# Patient Record
Sex: Female | Born: 1960 | Hispanic: No | Marital: Married
Health system: Southern US, Community
[De-identification: ages and names within clinical notes are randomized; demographics above are authoritative.]

---

## 1998-01-21 ENCOUNTER — Other Ambulatory Visit: Admission: RE | Admit: 1998-01-21 | Discharge: 1998-01-21 | Payer: Self-pay | Admitting: Obstetrics and Gynecology

## 1998-05-21 ENCOUNTER — Ambulatory Visit (HOSPITAL_COMMUNITY): Admission: RE | Admit: 1998-05-21 | Discharge: 1998-05-21 | Payer: Self-pay | Admitting: Obstetrics and Gynecology

## 1998-05-21 ENCOUNTER — Encounter: Payer: Self-pay | Admitting: Obstetrics and Gynecology

## 1998-10-26 ENCOUNTER — Inpatient Hospital Stay (HOSPITAL_COMMUNITY): Admission: AD | Admit: 1998-10-26 | Discharge: 1998-10-28 | Payer: Self-pay | Admitting: Obstetrics and Gynecology

## 1998-10-29 ENCOUNTER — Encounter (HOSPITAL_COMMUNITY): Admission: RE | Admit: 1998-10-29 | Discharge: 1999-01-27 | Payer: Self-pay | Admitting: Obstetrics and Gynecology

## 1999-03-10 ENCOUNTER — Other Ambulatory Visit: Admission: RE | Admit: 1999-03-10 | Discharge: 1999-03-10 | Payer: Self-pay | Admitting: *Deleted

## 2000-04-09 ENCOUNTER — Other Ambulatory Visit: Admission: RE | Admit: 2000-04-09 | Discharge: 2000-04-09 | Payer: Self-pay | Admitting: Obstetrics and Gynecology

## 2000-05-03 ENCOUNTER — Ambulatory Visit (HOSPITAL_COMMUNITY): Admission: RE | Admit: 2000-05-03 | Discharge: 2000-05-03 | Payer: Self-pay | Admitting: Obstetrics & Gynecology

## 2000-05-03 ENCOUNTER — Encounter: Payer: Self-pay | Admitting: Obstetrics & Gynecology

## 2000-09-24 ENCOUNTER — Inpatient Hospital Stay (HOSPITAL_COMMUNITY): Admission: AD | Admit: 2000-09-24 | Discharge: 2000-09-26 | Payer: Self-pay | Admitting: Obstetrics and Gynecology

## 2001-09-25 ENCOUNTER — Other Ambulatory Visit: Admission: RE | Admit: 2001-09-25 | Discharge: 2001-09-25 | Payer: Self-pay | Admitting: Obstetrics and Gynecology

## 2002-09-30 ENCOUNTER — Other Ambulatory Visit: Admission: RE | Admit: 2002-09-30 | Discharge: 2002-09-30 | Payer: Self-pay | Admitting: Obstetrics and Gynecology

## 2002-11-14 ENCOUNTER — Ambulatory Visit (HOSPITAL_COMMUNITY): Admission: RE | Admit: 2002-11-14 | Discharge: 2002-11-14 | Payer: Self-pay | Admitting: Obstetrics and Gynecology

## 2002-11-14 ENCOUNTER — Encounter: Payer: Self-pay | Admitting: Obstetrics and Gynecology

## 2003-11-16 ENCOUNTER — Other Ambulatory Visit: Admission: RE | Admit: 2003-11-16 | Discharge: 2003-11-16 | Payer: Self-pay | Admitting: Obstetrics and Gynecology

## 2004-12-21 ENCOUNTER — Other Ambulatory Visit: Admission: RE | Admit: 2004-12-21 | Discharge: 2004-12-21 | Payer: Self-pay | Admitting: Obstetrics and Gynecology

## 2006-01-24 ENCOUNTER — Other Ambulatory Visit: Admission: RE | Admit: 2006-01-24 | Discharge: 2006-01-24 | Payer: Self-pay | Admitting: Obstetrics and Gynecology

## 2006-02-14 ENCOUNTER — Ambulatory Visit (HOSPITAL_COMMUNITY): Admission: RE | Admit: 2006-02-14 | Discharge: 2006-02-14 | Payer: Self-pay | Admitting: Obstetrics and Gynecology

## 2006-03-08 ENCOUNTER — Emergency Department (HOSPITAL_COMMUNITY): Admission: EM | Admit: 2006-03-08 | Discharge: 2006-03-08 | Payer: Self-pay | Admitting: Emergency Medicine

## 2007-02-20 ENCOUNTER — Ambulatory Visit (HOSPITAL_COMMUNITY): Admission: RE | Admit: 2007-02-20 | Discharge: 2007-02-20 | Payer: Self-pay | Admitting: Obstetrics and Gynecology

## 2007-12-06 ENCOUNTER — Emergency Department (HOSPITAL_COMMUNITY): Admission: EM | Admit: 2007-12-06 | Discharge: 2007-12-07 | Payer: Self-pay | Admitting: Emergency Medicine

## 2008-05-18 ENCOUNTER — Ambulatory Visit (HOSPITAL_COMMUNITY): Admission: RE | Admit: 2008-05-18 | Discharge: 2008-05-18 | Payer: Self-pay | Admitting: Obstetrics and Gynecology

## 2008-05-18 ENCOUNTER — Encounter (INDEPENDENT_AMBULATORY_CARE_PROVIDER_SITE_OTHER): Payer: Self-pay | Admitting: Obstetrics and Gynecology

## 2009-12-08 ENCOUNTER — Ambulatory Visit (HOSPITAL_COMMUNITY): Admission: RE | Admit: 2009-12-08 | Discharge: 2009-12-08 | Payer: Self-pay | Admitting: Obstetrics and Gynecology

## 2009-12-16 ENCOUNTER — Encounter: Admission: RE | Admit: 2009-12-16 | Discharge: 2009-12-16 | Payer: Self-pay | Admitting: Obstetrics and Gynecology

## 2010-08-21 ENCOUNTER — Encounter: Payer: Self-pay | Admitting: Obstetrics and Gynecology

## 2010-12-13 NOTE — Op Note (Signed)
Brittney Meza, Brittney Meza                 ACCOUNT NO.:  [id number]   MEDICAL RECORD NO.:  [id number]          PATIENT TYPE:  AMB   LOCATION:  SDC                           FACILITY:  [facility]   PHYSICIAN:  Osborn Coho, M.D.   DATE OF BIRTH:  Jun 14, 1961   DATE OF PROCEDURE:  DATE OF DISCHARGE:                               OPERATIVE REPORT   PREOPERATIVE DIAGNOSES:  1. Menorrhagia  2. Submucosal fibroid.   POSTOPERATIVE DIAGNOSES:  1. Menorrhagia  2. Submucosal fibroid.   PROCEDURES:  1. Hysteroscopy.  2. D&C  3. Resection of fibroid with VersaPoint.   ATTENDING:  Osborn Coho, MD   ANESTHESIA:  General via LMA.   SPECIMENS TO PATHOLOGY:  Portions of resected fibroids and endometrial  curettings.   FLUIDS:  400 mL, hysteroscopic fluid deficit of normal saline 1100 mL.   ESTIMATED BLOOD LOSS:  50 mL   COMPLICATIONS:  None.   PROCEDURE:  The patient was taken to the operating room after the risks,  benefits, and alternatives were discussed with the patient.  The patient  verbalized understanding and consent signed and witnessed.  The patient  was placed under general anesthesia and prepped and draped in the normal  sterile fashion in the dorsal lithotomy position.  A bivalve speculum  was placed in the patient's vagina and the anterior lip of the cervix  was grasped with single-tooth tenaculum.  The patient was administered a  paracervical block using a total of 10 mL of 1% lidocaine.  The uterus  was sounded to 9.5 cm.  The cervix was dilated for passage of the  hysteroscope.  The hysteroscope was introduced and submucosal fibroid  noted on the anterior wall of the uterine cavity.  Resection was  performed and there was a very small portion of the fibroid that was  unable to be removed at the base and the wall of the uterus; however,  most  of the fibroid was resected without difficulty.  All instruments were  removed.  There was good hemostasis at tenaculum site after  applying  pressure with a ring forcep.  Count was correct.  The patient tolerated  the procedure well and is currently awaiting transfer to the recovery  room in good condition.      Osborn Coho, M.D.  Electronically Signed     AR/MEDQ  D:  05/18/2008  T:  05/18/2008  Job:  [id number]

## 2010-12-16 NOTE — H&P (Signed)
[facility]  Patient:    Brittney Meza, Brittney Meza                          MRN: [id number] Attending:  Janine Limbo, M.D. Dictator:   Mack Guise, C.N.M.                         History and Physical  DATE OF BIRTH:                1960/12/23                                Brittney Meza is a 50 year old gravida 8, para 2-1-4-3 at 52 4/7 weeks.  She presented to the office of [facility] with spontaneous rupture of membranes at home at approximately 6:30 this morning for clear fluid.  She is having only mild irregular contractions and on examination she is grossly ruptured with clear amniotic fluid, no bleeding.  She will be [facility] to [facility] for [facility].  Her pregnancy has been followed by the CNM service at [facility] and is remarkable for advanced maternal age, one preterm delivery at 35 weeks and two term deliveries, a history of rapid labor, greater than two ABs and group B strep negative.  She was initially evaluated at the office of [facility] on April 06, 2000 at [redacted] weeks gestation. EDC determined by early pregnancy ultrasonography confirmed with followup. Her pregnancy has been complicated by preterm contractions, but has otherwise been normal.  PRENATAL LABORATORIES:        Hemoglobin and hematocrit 14.2 and 41.4, platelets 305,000.  Blood type and Rh B+.  Antibody screen negative.  Sickle cell trait negative.  Rubella positive.  Hepatitis B surface antigen negative. HIV declined.  Pap smear within normal limits.  GC and chlamydia negative. AFP/free beta hCG declined on September 17, 2000.  One hour glucose challenge 79.  Hemoglobin 9.8 at 36 weeks.  Culture of the vaginal tract was negative for group B strep.  PAST MEDICAL HISTORY:         Abnormal Pap smear and colposcopy, STDs-chlamydia 1991, Trichomonas 1998.  Patient had a right lower jaw tumor and surgery in 1995.  FAMILY HISTORY:               Mother with hypertension.  Patients mother  with diabetes.  Father with kidney stones.  Brother with seizures.  GENETIC HISTORY:              Patient is 50 years old.  Otherwise there is no family history of familial or genetic disorders, children that died in infancy or that were born with birth defects.  PAST OBSTETRICAL HISTORY:     In 1988 induced AB, 1990 induced AB, 1995 normal spontaneous vaginal delivery at 35 weeks with the birth of a female 5 pounds 9 ounces, in 1996 SAB, 1997 SAB, 1998 normal spontaneous vaginal delivery at term with the birth of a 9 pound female infant, 2000 normal spontaneous vaginal delivery at term of an 8 pound 5 ounce female, and the present pregnancy.  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  Her husband, Lynnea Ferrier ______, is a professor.  He is involved and supportive.  They are of the  Christian faith.  REVIEW OF SYSTEMS:            There are no signs or symptoms suggestive of focal or systemic disease and the patient is typical of one with a uterine pregnancy at term with spontaneous rupture of membranes.  PHYSICAL EXAMINATION  VITAL SIGNS:                  Stable, afebrile.  HEENT:                        Unremarkable.  NECK:                         Thyroid not enlarged.  HEART:                        Regular rate and rhythm.  LUNGS:                        Clear to auscultation bilaterally.  ABDOMEN:                      Gravid in its contour.  Uterine fundus is noted to extend 38 cm above the level of the pubic symphysis.  Leopolds maneuvers finds the infant to be in a longitudinal lie, cephalic presentation. Estimated fetal weight is 8 pounds.  EXTREMITIES:                  No pathologic edema.  PELVIC:                       Membranes are grossly ruptured.  Cervical examination is deferred.  ASSESSMENT:                   Intrauterine pregnancy at term, premature rupture of membranes.  PLAN:                         Admit to  [facility] hospital per Dr. Marline Backbone.  Routine CNM orders.  Will follow to see if labor occurs spontaneously or if augmentation of labor is necessary.  Anticipate spontaneous vaginal delivery. DD:  09/24/00 TD:  09/24/00 Job: [id number]

## 2011-02-06 ENCOUNTER — Other Ambulatory Visit: Payer: Self-pay | Admitting: Obstetrics and Gynecology

## 2011-02-06 DIAGNOSIS — Z1231 Encounter for screening mammogram for malignant neoplasm of breast: Secondary | ICD-10-CM

## 2011-03-06 ENCOUNTER — Ambulatory Visit
Admission: RE | Admit: 2011-03-06 | Discharge: 2011-03-06 | Disposition: A | Discharge status: Home / Self Care | Payer: BC Managed Care – PPO | Source: Ambulatory Visit | Attending: Obstetrics and Gynecology | Admitting: Obstetrics and Gynecology

## 2011-03-06 DIAGNOSIS — Z1231 Encounter for screening mammogram for malignant neoplasm of breast: Secondary | ICD-10-CM

## 2011-05-01 LAB — CBC
MCHC: 34
MCV: 87.8
Platelets: 225
RBC: 4.33
RDW: 14.8
WBC: 5.3

## 2011-05-09 ENCOUNTER — Other Ambulatory Visit (HOSPITAL_COMMUNITY)
Admission: RE | Admit: 2011-05-09 | Discharge: 2011-05-09 | Disposition: A | Discharge status: Home / Self Care | Payer: BC Managed Care – PPO | Source: Ambulatory Visit | Attending: Family Medicine | Admitting: Family Medicine

## 2011-05-09 ENCOUNTER — Other Ambulatory Visit: Payer: Self-pay | Admitting: Family Medicine

## 2011-05-09 DIAGNOSIS — Z1159 Encounter for screening for other viral diseases: Secondary | ICD-10-CM | POA: Insufficient documentation

## 2011-05-09 DIAGNOSIS — Z124 Encounter for screening for malignant neoplasm of cervix: Secondary | ICD-10-CM | POA: Insufficient documentation

## 2013-10-22 ENCOUNTER — Other Ambulatory Visit: Payer: Self-pay

## 2013-10-22 DIAGNOSIS — Z1231 Encounter for screening mammogram for malignant neoplasm of breast: Secondary | ICD-10-CM

## 2013-10-30 ENCOUNTER — Ambulatory Visit: Payer: BC Managed Care – PPO

## 2013-11-06 ENCOUNTER — Ambulatory Visit
Admission: RE | Admit: 2013-11-06 | Discharge: 2013-11-06 | Disposition: A | Discharge status: Home / Self Care | Payer: BC Managed Care – PPO | Source: Ambulatory Visit

## 2013-11-06 DIAGNOSIS — Z1231 Encounter for screening mammogram for malignant neoplasm of breast: Secondary | ICD-10-CM

## 2013-11-10 ENCOUNTER — Other Ambulatory Visit: Payer: Self-pay | Admitting: Family Medicine

## 2013-11-10 DIAGNOSIS — R928 Other abnormal and inconclusive findings on diagnostic imaging of breast: Secondary | ICD-10-CM

## 2013-11-20 ENCOUNTER — Ambulatory Visit
Admission: RE | Admit: 2013-11-20 | Discharge: 2013-11-20 | Disposition: A | Discharge status: Home / Self Care | Payer: BC Managed Care – PPO | Source: Ambulatory Visit | Attending: Family Medicine | Admitting: Family Medicine

## 2013-11-20 ENCOUNTER — Encounter (INDEPENDENT_AMBULATORY_CARE_PROVIDER_SITE_OTHER): Payer: Self-pay

## 2013-11-20 DIAGNOSIS — R928 Other abnormal and inconclusive findings on diagnostic imaging of breast: Secondary | ICD-10-CM

## 2015-08-27 ENCOUNTER — Other Ambulatory Visit: Payer: Self-pay

## 2015-08-27 DIAGNOSIS — Z1231 Encounter for screening mammogram for malignant neoplasm of breast: Secondary | ICD-10-CM

## 2015-09-15 ENCOUNTER — Other Ambulatory Visit: Payer: Self-pay | Admitting: Family Medicine

## 2015-09-15 ENCOUNTER — Other Ambulatory Visit (HOSPITAL_COMMUNITY)
Admission: RE | Admit: 2015-09-15 | Discharge: 2015-09-15 | Disposition: A | Discharge status: Home / Self Care | Payer: BC Managed Care – PPO | Source: Ambulatory Visit | Attending: Family Medicine | Admitting: Family Medicine

## 2015-09-15 ENCOUNTER — Ambulatory Visit
Admission: RE | Admit: 2015-09-15 | Discharge: 2015-09-15 | Disposition: A | Discharge status: Home / Self Care | Payer: BC Managed Care – PPO | Source: Ambulatory Visit

## 2015-09-15 DIAGNOSIS — Z1151 Encounter for screening for human papillomavirus (HPV): Secondary | ICD-10-CM | POA: Diagnosis present

## 2015-09-15 DIAGNOSIS — Z1231 Encounter for screening mammogram for malignant neoplasm of breast: Secondary | ICD-10-CM

## 2015-09-15 DIAGNOSIS — Z01419 Encounter for gynecological examination (general) (routine) without abnormal findings: Secondary | ICD-10-CM | POA: Insufficient documentation

## 2015-09-15 DIAGNOSIS — N950 Postmenopausal bleeding: Secondary | ICD-10-CM

## 2015-09-16 LAB — CYTOLOGY - PAP

## 2015-09-27 ENCOUNTER — Ambulatory Visit
Admission: RE | Admit: 2015-09-27 | Discharge: 2015-09-27 | Disposition: A | Discharge status: Home / Self Care | Payer: BC Managed Care – PPO | Source: Ambulatory Visit | Attending: Family Medicine | Admitting: Family Medicine

## 2015-09-27 DIAGNOSIS — N950 Postmenopausal bleeding: Secondary | ICD-10-CM

## 2017-02-21 ENCOUNTER — Ambulatory Visit (INDEPENDENT_AMBULATORY_CARE_PROVIDER_SITE_OTHER): Payer: BC Managed Care – PPO

## 2017-02-21 ENCOUNTER — Ambulatory Visit (INDEPENDENT_AMBULATORY_CARE_PROVIDER_SITE_OTHER): Payer: Self-pay

## 2017-02-21 ENCOUNTER — Ambulatory Visit (INDEPENDENT_AMBULATORY_CARE_PROVIDER_SITE_OTHER): Payer: BC Managed Care – PPO | Admitting: Orthopedic Surgery

## 2017-02-21 DIAGNOSIS — M79601 Pain in right arm: Secondary | ICD-10-CM

## 2017-02-21 MED ORDER — GABAPENTIN 100 MG PO CAPS: 100.0000 mg | ORAL_CAPSULE | Freq: Two times a day (BID) | ORAL | 0 refills | Status: AC

## 2017-02-23 ENCOUNTER — Encounter (INDEPENDENT_AMBULATORY_CARE_PROVIDER_SITE_OTHER): Payer: Self-pay | Admitting: Orthopedic Surgery

## 2017-02-23 NOTE — Progress Notes (Signed)
Office Visit Note   Patient: Brittney Meza           Date of Birth: September 18, 1960           MRN: [id number] Visit Date: 02/21/2017 Requested by: No referring provider defined for this encounter. PCP: Sigmund HazelMiller, Lisa, MD  Subjective: Chief Complaint  Patient presents with  . Right Shoulder - Pain  . Right Elbow - Pain    HPI: Patient is a 56 year old right-hand-dominant patient with right arm pain of several months duration.  Started after gardening about 2 or 3 weeks ago.  Was hurting her before then but gardening really send her over the edge.  Pain radiates from the shoulder down the arm.  She reports some tenderness in the palm of her hand is well around the thenar eminence and dorsally.  Patient states her pain is constant and worse with use.  She states that she has decreased strength.  Reports that holding things at times is difficult.  Denies much in way of neck pain or any numbness and tingling in her arm.  She takes Advil for pain.              ROS: All systems reviewed are negative as they relate to the chief complaint within the history of present illness.  Patient denies  fevers or chills.   Assessment & Plan: Visit Diagnoses:  1. Right arm pain     Plan: Impression is right arm pain which looks most classically like radial tunnel syndrome and/or posterior interosseous nerve compression.  She's having vague forearm pain which is waking her from sleep at night.  No real localizing tenderness to the lateral epicondyle.  Radiographs are normal.  Pain is debilitating in this person by her own admission which I do believe is a very stoic person and can't take a lot of pain.  Plan is to try Neurontin which is described to the patient is an off label use for this particular type of nerve pain.  Also want to send her to Dr. Alvester MorinNewton for nerve conduction study to evaluate for posterior interosseous nerve compression  Follow-Up Instructions: No Follow-up on file.   Orders:  Orders Placed  This Encounter  Procedures  . XR Shoulder Right  . XR Elbow 2 Views Right  . Ambulatory referral to Physical Medicine Rehab   Meds ordered this encounter  Medications  . gabapentin (NEURONTIN) 100 MG capsule    Sig: Take 1 capsule (100 mg total) by mouth 2 (two) times daily.    Dispense:  42 capsule    Refill:  0      Procedures: No procedures performed   Clinical Data: No additional findings.  Objective: Vital Signs: There were no vitals taken for this visit.  Physical Exam:   Constitutional: Patient appears well-developed HEENT:  Head: Normocephalic Eyes:EOM are normal Neck: Normal range of motion Cardiovascular: Normal rate Pulmonary/chest: Effort normal Neurologic: Patient is alert Skin: Skin is warm Psychiatric: Patient has normal mood and affect    Ortho Exam: Orthopedic exam demonstrates good cervical spine range of motion symmetric reflexes bilateral biceps and triceps.  A little bit of pain with the grip testing on the right compared to the left.  She definitely has pain with resisted supination on the right and not on the left.  Tenderness over the proximal aspect of the supinator between the mobile wad and the extensors about a hand breath distal to the lateral condyle.  No real definitive pain or tenderness  to palpation around the lateral condyle.  Does have pain with full extension.  No pain with resisted pronation.  No definite paresthesias in the radial distribution although she is having some vague pain in this thenar region both volar and dorsal.  Grind test negative for CMC arthritis.  No real pain with resisted wrist extension and finger extension lateralizing to the lateral epicondyle on the right arm  Specialty Comments:  No specialty comments available.  Imaging: No results found.   PMFS History: There are no active problems to display for this patient.  No past medical history on file.  No family history on file.  No past surgical history  on file. Social History   Occupational History  . Not on file.   Social History Main Topics  . Smoking status: Not on file  . Smokeless tobacco: Not on file  . Alcohol use Not on file  . Drug use: Unknown  . Sexual activity: Not on file

## 2017-03-08 ENCOUNTER — Ambulatory Visit (INDEPENDENT_AMBULATORY_CARE_PROVIDER_SITE_OTHER): Payer: BC Managed Care – PPO | Admitting: Physical Medicine and Rehabilitation

## 2017-03-08 DIAGNOSIS — M792 Neuralgia and neuritis, unspecified: Secondary | ICD-10-CM | POA: Diagnosis not present

## 2017-03-08 DIAGNOSIS — G5631 Lesion of radial nerve, right upper limb: Secondary | ICD-10-CM

## 2017-03-08 NOTE — Progress Notes (Deleted)
Right arm pain for several months. Pain from shoulder down the arm. Worse from elbow down. Says its constant and worse when she uses arm or tries to pick things up or hold things .Denies numbness or tingling.

## 2017-03-12 NOTE — Procedures (Signed)
EMG & NCV Findings: Evaluation of the right radial motor nerve showed prolonged distal onset latency (3.1 ms).  All remaining nerves (as indicated in the following tables) were within normal limits.    All examined muscles (as indicated in the following table) showed no evidence of electrical instability.    Impression: The above electrodiagnostic study is ABNORMAL and reveals evidence of a mild right radial nerve lesion in the forearm affecting motor components. This evidence is very mild and careful clinical correlation is paramount  There is no significant electrodiagnostic evidence of any other focal nerve entrapment, brachial plexopathy or cervical radiculopathy.   Recommendations: 1.  Follow-up with referring physician. 2.  Continue current management of symptoms.   Nerve Conduction Studies Anti Sensory Summary Table   Stim Site NR Peak (ms) Norm Peak (ms) P-T Amp (V) Norm P-T Amp Site1 Site2 Delta-P (ms) Dist (cm) Vel (m/s) Norm Vel (m/s)  Right Median Acr Palm Anti Sensory (2nd Digit)  31.9C  Wrist    3.1 <3.6 35.4 >10 Wrist Palm 1.5 0.0    Palm    1.6 <2.0 30.5         Right Radial Anti Sensory (Base 1st Digit)  32.3C  Wrist    1.7 <3.1 23.7  Wrist Base 1st Digit 1.7 8.0 47   Right Ulnar Anti Sensory (5th Digit)  32C  Wrist    3.0 <3.7 36.9 >15.0 Wrist 5th Digit 3.0 14.0 47 >38   Motor Summary Table   Stim Site NR Onset (ms) Norm Onset (ms) O-P Amp (mV) Norm O-P Amp Site1 Site2 Delta-0 (ms) Dist (cm) Vel (m/s) Norm Vel (m/s)  Right Median Motor (Abd Poll Brev)  32.4C  Wrist    3.2 <4.2 7.2 >5 Elbow Wrist 3.9 20.8 53 >50  Elbow    7.1  7.8         Right Radial Motor (Ext Indicis)  31.8C  8cm    *3.1 <2.5 3.8 >1.7 Up Arm 8cm 3.2 21.0 66 >60  Up Arm    6.3  4.8         Right Ulnar Motor (Abd Dig Min)  32.7C  Wrist    2.5 <4.2 12.5 >3 B Elbow Wrist 3.4 19.5 57 >53  B Elbow    5.9  8.1  A Elbow B Elbow 1.2 10.0 83 >53  A Elbow    7.1  8.4          EMG   Side  Muscle Nerve Root Ins Act Fibs Psw Amp Dur Poly Recrt Int Dennie Bible Comment  Right Abd Poll Brev Median C8-T1 Nml Nml Nml Nml Nml 0 Nml Nml   Right 1stDorInt Ulnar C8-T1 Nml Nml Nml Nml Nml 0 Nml Nml   Right ExtIndicis Radial (Post Int) C7-8 Nml Nml Nml Nml Nml 0 Nml Nml   Right ExtDigCom   Nml Nml Nml Nml Nml 0 Nml Nml   Right BrachioRad Radial C5-6 Nml Nml Nml Nml Nml 0 Nml Nml   Right Triceps Radial C6-7-8 Nml Nml Nml Nml Nml 0 Nml Nml   Right Deltoid Axillary C5-6 Nml Nml Nml Nml Nml 0 Nml Nml     Nerve Conduction Studies Anti Sensory Left/Right Comparison   Stim Site L Lat (ms) R Lat (ms) L-R Lat (ms) L Amp (V) R Amp (V) L-R Amp (%) Site1 Site2 L Vel (m/s) R Vel (m/s) L-R Vel (m/s)  Median Acr Palm Anti Sensory (2nd Digit)  31.9C  Wrist  3.1  35.4  Wrist Palm     Palm  1.6   30.5        Radial Anti Sensory (Base 1st Digit)  32.3C  Wrist  1.7   23.7  Wrist Base 1st Digit  47   Ulnar Anti Sensory (5th Digit)  32C  Wrist  3.0   36.9  Wrist 5th Digit  47    Motor Left/Right Comparison   Stim Site L Lat (ms) R Lat (ms) L-R Lat (ms) L Amp (mV) R Amp (mV) L-R Amp (%) Site1 Site2 L Vel (m/s) R Vel (m/s) L-R Vel (m/s)  Median Motor (Abd Poll Brev)  32.4C  Wrist  3.2   7.2  Elbow Wrist  53   Elbow  7.1   7.8        Radial Motor (Ext Indicis)  31.8C  8cm  *3.1   3.8  Up Arm 8cm  66   Up Arm  6.3   4.8        Ulnar Motor (Abd Dig Min)  32.7C  Wrist  2.5   12.5  B Elbow Wrist  57   B Elbow  5.9   8.1  A Elbow B Elbow  83   A Elbow  7.1   8.4           Waveforms:

## 2017-03-12 NOTE — Progress Notes (Signed)
Brittney Meza - 56 y.o. female MRN [id number]  Date of birth: 09/13/1960  Office Visit Note: Visit Date: 03/08/2017 PCP: Sigmund Hazel, MD Referred by: Sigmund Hazel, MD  Subjective: Chief Complaint  Patient presents with  . Right Arm - Pain   HPI:  Brittney Meza a 56 year old right-hand dominant female with several month history of right shoulder pain referring to the right forearm and worst at the lateral elbow.  She denies frank numbness or paresthesia.  She denies left sided symptoms or frank neck pain.  She reports constant symptoms but worse with movement and picking up things or holding objects.  She has not had prior electrodiagnostics.  Dr. August Saucer is questioning radial tunnel syndrome or lesion of the posterior interosseus nerve on the right.    ROS Otherwise per HPI.  Assessment & Plan: Visit Diagnoses:  1. Neuritis of upper extremity   2. Radial nerve dysfunction, right     Plan: No additional findings.  Impression: The above electrodiagnostic study is ABNORMAL and reveals evidence of a mild right radial nerve lesion in the forearm affecting motor components. This evidence is very mild and careful clinical correlation is paramount  There is no significant electrodiagnostic evidence of any other focal nerve entrapment, brachial plexopathy or cervical radiculopathy.   Recommendations: 1.  Follow-up with referring physician. 2.  Continue current management of symptoms.    Meds & Orders: No orders of the defined types were placed in this encounter.   Orders Placed This Encounter  Procedures  . NCV with EMG (electromyography)    Follow-up: Return for Dr. August Saucer.   Procedures: No procedures performed  EMG & NCV Findings: Evaluation of the right radial motor nerve showed prolonged distal onset latency (3.1 ms).  All remaining nerves (as indicated in the following tables) were within normal limits.    All examined muscles (as indicated in the following table) showed no  evidence of electrical instability.    Impression: The above electrodiagnostic study is ABNORMAL and reveals evidence of a mild right radial nerve lesion in the forearm affecting motor components. This evidence is very mild and careful clinical correlation is paramount  There is no significant electrodiagnostic evidence of any other focal nerve entrapment, brachial plexopathy or cervical radiculopathy.   Recommendations: 1.  Follow-up with referring physician. 2.  Continue current management of symptoms.   Nerve Conduction Studies Anti Sensory Summary Table   Stim Site NR Peak (ms) Norm Peak (ms) P-T Amp (V) Norm P-T Amp Site1 Site2 Delta-P (ms) Dist (cm) Vel (m/s) Norm Vel (m/s)  Right Median Acr Palm Anti Sensory (2nd Digit)  31.9C  Wrist    3.1 <3.6 35.4 >10 Wrist Palm 1.5 0.0    Palm    1.6 <2.0 30.5         Right Radial Anti Sensory (Base 1st Digit)  32.3C  Wrist    1.7 <3.1 23.7  Wrist Base 1st Digit 1.7 8.0 47   Right Ulnar Anti Sensory (5th Digit)  32C  Wrist    3.0 <3.7 36.9 >15.0 Wrist 5th Digit 3.0 14.0 47 >38   Motor Summary Table   Stim Site NR Onset (ms) Norm Onset (ms) O-P Amp (mV) Norm O-P Amp Site1 Site2 Delta-0 (ms) Dist (cm) Vel (m/s) Norm Vel (m/s)  Right Median Motor (Abd Poll Brev)  32.4C  Wrist    3.2 <4.2 7.2 >5 Elbow Wrist 3.9 20.8 53 >50  Elbow    7.1  7.8  Right Radial Motor (Ext Indicis)  31.8C  8cm    *3.1 <2.5 3.8 >1.7 Up Arm 8cm 3.2 21.0 66 >60  Up Arm    6.3  4.8         Right Ulnar Motor (Abd Dig Min)  32.7C  Wrist    2.5 <4.2 12.5 >3 B Elbow Wrist 3.4 19.5 57 >53  B Elbow    5.9  8.1  A Elbow B Elbow 1.2 10.0 83 >53  A Elbow    7.1  8.4          EMG   Side Muscle Nerve Root Ins Act Fibs Psw Amp Dur Poly Recrt Int Dennie Bible Comment  Right Abd Poll Brev Median C8-T1 Nml Nml Nml Nml Nml 0 Nml Nml   Right 1stDorInt Ulnar C8-T1 Nml Nml Nml Nml Nml 0 Nml Nml   Right ExtIndicis Radial (Post Int) C7-8 Nml Nml Nml Nml Nml 0 Nml Nml   Right  ExtDigCom   Nml Nml Nml Nml Nml 0 Nml Nml   Right BrachioRad Radial C5-6 Nml Nml Nml Nml Nml 0 Nml Nml   Right Triceps Radial C6-7-8 Nml Nml Nml Nml Nml 0 Nml Nml   Right Deltoid Axillary C5-6 Nml Nml Nml Nml Nml 0 Nml Nml     Nerve Conduction Studies Anti Sensory Left/Right Comparison   Stim Site L Lat (ms) R Lat (ms) L-R Lat (ms) L Amp (V) R Amp (V) L-R Amp (%) Site1 Site2 L Vel (m/s) R Vel (m/s) L-R Vel (m/s)  Median Acr Palm Anti Sensory (2nd Digit)  31.9C  Wrist  3.1   35.4  Wrist Palm     Palm  1.6   30.5        Radial Anti Sensory (Base 1st Digit)  32.3C  Wrist  1.7   23.7  Wrist Base 1st Digit  47   Ulnar Anti Sensory (5th Digit)  32C  Wrist  3.0   36.9  Wrist 5th Digit  47    Motor Left/Right Comparison   Stim Site L Lat (ms) R Lat (ms) L-R Lat (ms) L Amp (mV) R Amp (mV) L-R Amp (%) Site1 Site2 L Vel (m/s) R Vel (m/s) L-R Vel (m/s)  Median Motor (Abd Poll Brev)  32.4C  Wrist  3.2   7.2  Elbow Wrist  53   Elbow  7.1   7.8        Radial Motor (Ext Indicis)  31.8C  8cm  *3.1   3.8  Up Arm 8cm  66   Up Arm  6.3   4.8        Ulnar Motor (Abd Dig Min)  32.7C  Wrist  2.5   12.5  B Elbow Wrist  57   B Elbow  5.9   8.1  A Elbow B Elbow  83   A Elbow  7.1   8.4           Waveforms:             Clinical History: No specialty comments available.  She has an unknown smoking status. She has never used smokeless tobacco. No results for input(s): HGBA1C, LABURIC in the last 8760 hours.  Objective:  VS:  HT:    WT:   BMI:     BP:   HR: bpm  TEMP: ( )  RESP:  Physical Exam  Musculoskeletal:  There is tenderness over the extensor wad. There is intact sensation to light touch.  There is some pain with resisted pronation. There is no atrophy.    Ortho Exam Imaging: No results found.  Past Medical/Family/Surgical/Social History: Medications & Allergies reviewed per EMR There are no active problems to display for this patient.  No past medical history on  file. No family history on file. No past surgical history on file. Social History   Occupational History  . Not on file.   Social History Main Topics  . Smoking status: Unknown If Ever Smoked  . Smokeless tobacco: Never Used  . Alcohol use Not on file  . Drug use: Unknown  . Sexual activity: Not on file

## 2017-03-26 ENCOUNTER — Ambulatory Visit (INDEPENDENT_AMBULATORY_CARE_PROVIDER_SITE_OTHER): Payer: BC Managed Care – PPO | Admitting: Orthopedic Surgery

## 2017-03-26 ENCOUNTER — Encounter (INDEPENDENT_AMBULATORY_CARE_PROVIDER_SITE_OTHER): Payer: Self-pay | Admitting: Orthopedic Surgery

## 2017-03-26 DIAGNOSIS — G5631 Lesion of radial nerve, right upper limb: Secondary | ICD-10-CM | POA: Diagnosis not present

## 2017-03-28 NOTE — Progress Notes (Signed)
   Office Visit Note   Patient: Brittney Meza           Date of Birth: 01-26-1961           MRN: [id number] Visit Date: 03/26/2017 Requested by: Sigmund HazelMiller, Lisa, MD [address] [place], [place] [address] PCP: Sigmund HazelMiller, Lisa, MD  Subjective: Chief Complaint  Patient presents with  . Follow-up    review emg/ncv    HPI: Patient is a [age] show patient with right arm pain.  Since I have seen her she has had an EMG nerve study which is reviewed.  It does show mild motor slowing of the radial nerve in the radial tunnel on the right.  She has tried Neurontin which hasn't helped.  In general this is a pain she can live with.  She states the pain comes and goes.  She has not been as vigorous in terms of exercising and doing gardening as she was when this started.              ROS: All systems reviewed are negative as they relate to the chief complaint within the history of present illness.  Patient denies  fevers or chills.   Assessment & Plan: Visit Diagnoses: No diagnosis found.  Impression is mild posterior interosseous nerve compression.  Plan is observation.  I think this is something that should she develop progressive weakness and loss of dexterity then treatment would be indicated.  For now she is going to observe this problem.  I'll see her back as needed.  Follow-Up Instructions: Return if symptoms worsen or fail to improve.   Orders:  No orders of the defined types were placed in this encounter.  No orders of the defined types were placed in this encounter.     Procedures: No procedures performed   Clinical Data: No additional findings.  Objective: Vital Signs: There were no vitals taken for this visit.  Physical Exam:   Constitutional: Patient appears well-developed HEENT:  Head: Normocephalic Eyes:EOM are normal Neck: Normal range of motion Cardiovascular: Normal rate Pulmonary/chest: Effort normal Neurologic: Patient is alert Skin: Skin is warm Psychiatric:  Patient has normal mood and affect    Ortho Exam: Orthopedic exam demonstrates good cervical spine range of motion 5 out of 5 grip EPL FPL interosseous wrist flexion as extension strength.  Does have some tenderness in the radial tunnel around the supinator.  Does have pain with resisted supination on the right-hand side.  Not much in way of pain over the lateral epicondyle.  Otherwise the exam is unchanged  Specialty Comments:  No specialty comments available.  Imaging: No results found.   PMFS History: There are no active problems to display for this patient.  No past medical history on file.  No family history on file.  No past surgical history on file. Social History   Occupational History  . Not on file.   Social History Main Topics  . Smoking status: Unknown If Ever Smoked  . Smokeless tobacco: Never Used  . Alcohol use Not on file  . Drug use: Unknown  . Sexual activity: Not on file

## 2017-09-28 ENCOUNTER — Other Ambulatory Visit: Payer: Self-pay | Admitting: Family Medicine

## 2017-09-28 DIAGNOSIS — Z1231 Encounter for screening mammogram for malignant neoplasm of breast: Secondary | ICD-10-CM

## 2017-10-17 ENCOUNTER — Other Ambulatory Visit: Payer: Self-pay | Admitting: Family Medicine

## 2017-10-17 ENCOUNTER — Ambulatory Visit
Admission: RE | Admit: 2017-10-17 | Discharge: 2017-10-17 | Disposition: A | Discharge status: Home / Self Care | Payer: BC Managed Care – PPO | Source: Ambulatory Visit | Attending: Family Medicine | Admitting: Family Medicine

## 2017-10-17 DIAGNOSIS — Z1231 Encounter for screening mammogram for malignant neoplasm of breast: Secondary | ICD-10-CM

## 2019-02-06 ENCOUNTER — Other Ambulatory Visit: Payer: Self-pay | Admitting: Family Medicine

## 2019-02-06 DIAGNOSIS — Z1231 Encounter for screening mammogram for malignant neoplasm of breast: Secondary | ICD-10-CM

## 2019-03-06 ENCOUNTER — Ambulatory Visit: Payer: BC Managed Care – PPO

## 2019-04-10 ENCOUNTER — Ambulatory Visit
Admission: RE | Admit: 2019-04-10 | Discharge: 2019-04-10 | Disposition: A | Discharge status: Home / Self Care | Payer: BC Managed Care – PPO | Source: Ambulatory Visit | Attending: Family Medicine | Admitting: Family Medicine

## 2019-04-10 ENCOUNTER — Other Ambulatory Visit: Payer: Self-pay

## 2019-04-10 DIAGNOSIS — Z1231 Encounter for screening mammogram for malignant neoplasm of breast: Secondary | ICD-10-CM

## 2019-07-12 ENCOUNTER — Other Ambulatory Visit: Payer: Self-pay

## 2019-07-12 DIAGNOSIS — Z20822 Contact with and (suspected) exposure to covid-19: Secondary | ICD-10-CM

## 2019-07-14 LAB — NOVEL CORONAVIRUS, NAA: SARS-CoV-2, NAA: NOT DETECTED

## 2019-08-16 ENCOUNTER — Other Ambulatory Visit: Payer: Self-pay

## 2019-08-16 DIAGNOSIS — Z20822 Contact with and (suspected) exposure to covid-19: Secondary | ICD-10-CM

## 2019-08-17 LAB — NOVEL CORONAVIRUS, NAA: SARS-CoV-2, NAA: NOT DETECTED

## 2019-09-27 ENCOUNTER — Ambulatory Visit: Payer: BC Managed Care – PPO | Attending: Internal Medicine

## 2019-09-27 DIAGNOSIS — Z23 Encounter for immunization: Secondary | ICD-10-CM | POA: Insufficient documentation

## 2019-09-27 NOTE — Progress Notes (Signed)
   Covid-19 Vaccination Clinic  Name:  Nicie Milan    MRN: [id number] DOB: [birth year]  09/27/2019  Ms. Stroschein was observed post Covid-19 immunization for 15 minutes without incidence. She was provided with Vaccine Information Sheet and instruction to access the V-Safe system.   Ms. Mounsey was instructed to call 911 with any severe reactions post vaccine: Marland Kitchen Difficulty breathing  . Swelling of your face and throat  . A fast heartbeat  . A bad rash all over your body  . Dizziness and weakness    Immunizations Administered    Name Date Dose VIS Date Route   Pfizer COVID-19 Vaccine 09/27/2019  3:43 PM 0.3 mL 07/11/2019 Intramuscular   Manufacturer: [employer], [employer]   Lot: [id number]   NDC: [address]-6

## 2019-10-18 ENCOUNTER — Ambulatory Visit: Payer: BC Managed Care – PPO | Attending: Internal Medicine

## 2019-10-18 DIAGNOSIS — Z23 Encounter for immunization: Secondary | ICD-10-CM

## 2019-10-18 NOTE — Progress Notes (Signed)
   Covid-19 Vaccination [facility]  Name:  Brittney Meza    MRN: [id number] DOB: [birth year]  10/18/2019  Brittney Meza was observed post Covid-19 immunization for 15 minutes without incident. She was provided with Vaccine Information Sheet and instruction to access the V-Safe system.   Brittney Meza was instructed to call 911 with any severe reactions post vaccine: Marland Kitchen Difficulty breathing  . Swelling of face and throat  . A fast heartbeat  . A bad rash all over body  . Dizziness and weakness   Immunizations Administered    Name Date Dose VIS Date Route   Pfizer COVID-19 Vaccine 10/18/2019  1:14 PM 0.3 mL 07/11/2019 Intramuscular   Manufacturer: [employer], [employer]   Lot: [id number]   NDC: [address]-8

## 2020-08-11 ENCOUNTER — Other Ambulatory Visit: Payer: Self-pay

## 2020-08-11 DIAGNOSIS — Z20822 Contact with and (suspected) exposure to covid-19: Secondary | ICD-10-CM

## 2020-08-14 ENCOUNTER — Other Ambulatory Visit: Payer: Self-pay

## 2020-08-14 DIAGNOSIS — Z20822 Contact with and (suspected) exposure to covid-19: Secondary | ICD-10-CM

## 2020-08-14 LAB — NOVEL CORONAVIRUS, NAA: SARS-CoV-2, NAA: DETECTED — AB

## 2020-08-19 LAB — NOVEL CORONAVIRUS, NAA

## 2020-12-30 ENCOUNTER — Other Ambulatory Visit: Payer: Self-pay | Admitting: Family Medicine

## 2020-12-30 DIAGNOSIS — Z1231 Encounter for screening mammogram for malignant neoplasm of breast: Secondary | ICD-10-CM

## 2021-01-05 ENCOUNTER — Ambulatory Visit
Admission: RE | Admit: 2021-01-05 | Discharge: 2021-01-05 | Disposition: A | Discharge status: Home / Self Care | Payer: BC Managed Care – PPO | Source: Ambulatory Visit | Attending: Family Medicine | Admitting: Family Medicine

## 2021-01-05 ENCOUNTER — Other Ambulatory Visit: Payer: Self-pay

## 2021-01-05 DIAGNOSIS — Z1231 Encounter for screening mammogram for malignant neoplasm of breast: Secondary | ICD-10-CM

## 2022-03-07 ENCOUNTER — Other Ambulatory Visit: Payer: Self-pay | Admitting: Family Medicine

## 2022-03-07 DIAGNOSIS — Z1231 Encounter for screening mammogram for malignant neoplasm of breast: Secondary | ICD-10-CM

## 2022-03-15 ENCOUNTER — Ambulatory Visit
Admission: RE | Admit: 2022-03-15 | Discharge: 2022-03-15 | Disposition: A | Discharge status: Home / Self Care | Payer: BC Managed Care – PPO | Source: Ambulatory Visit | Attending: Family Medicine | Admitting: Family Medicine

## 2022-03-15 DIAGNOSIS — Z1231 Encounter for screening mammogram for malignant neoplasm of breast: Secondary | ICD-10-CM

## 2022-06-18 ENCOUNTER — Telehealth: Payer: BC Managed Care – PPO | Admitting: Family

## 2022-06-18 DIAGNOSIS — U071 COVID-19: Secondary | ICD-10-CM | POA: Diagnosis not present

## 2022-06-18 MED ORDER — MOLNUPIRAVIR EUA 200MG CAPSULE: 4.0000 | ORAL_CAPSULE | Freq: Two times a day (BID) | ORAL | 0 refills | Status: AC

## 2022-06-18 MED ORDER — BENZONATATE 100 MG PO CAPS: 100.0000 mg | ORAL_CAPSULE | Freq: Three times a day (TID) | ORAL | 0 refills | Status: AC | PRN

## 2022-06-18 NOTE — Progress Notes (Signed)
Virtual Visit Consent   Alma Mohiuddin, you are scheduled for a virtual visit with a [facility] provider today. Just as with appointments in the office, your consent must be obtained to participate. Your consent will be active for this visit and any virtual visit you may have with one of our providers in the next 365 days. If you have a MyChart account, a copy of this consent can be sent to you electronically.  As this is a virtual visit, video technology does not allow for your provider to perform a traditional examination. This may limit your provider's ability to fully assess your condition. If your provider identifies any concerns that need to be evaluated in person or the need to arrange testing (such as labs, EKG, etc.), we will make arrangements to do so. Although advances in technology are sophisticated, we cannot ensure that it will always work on either your end or our end. If the connection with a video visit is poor, the visit may have to be switched to a telephone visit. With either a video or telephone visit, we are not always able to ensure that we have a secure connection.  By engaging in this virtual visit, you consent to the provision of healthcare and authorize for your insurance to be billed (if applicable) for the services provided during this visit. Depending on your insurance coverage, you may receive a charge related to this service.  I need to obtain your verbal consent now. Are you willing to proceed with your visit today? Taiyana Kissler has provided verbal consent on 06/18/2022 for a virtual visit (video or telephone). Jannifer Rodney, FNP  Date: 06/18/2022 3:14 PM  Virtual Visit via Video Note   I, Jannifer Rodney, connected with  Brittney Meza  ([id number], 04-09-1961) on 06/18/22 at  3:15 PM EST by a video-enabled telemedicine application and verified that I am speaking with the correct person using two identifiers.  Location: Patient: Virtual Visit Location Patient:  Home Provider: Virtual Visit Location Provider: Home Office   I discussed the limitations of evaluation and management by telemedicine and the availability of in person appointments. The patient expressed understanding and agreed to proceed.    History of Present Illness: Brittney Meza is a 61 y.o. who identifies as a female who was assigned female at birth, and is being seen today for cough and congestion that started two days ago. Tested positive for COVID today.   HPI: URI  This is a new problem. The current episode started in the past 7 days. The problem has been gradually worsening. There has been no fever. Associated symptoms include congestion, coughing, headaches, rhinorrhea, sinus pain and a sore throat. Pertinent negatives include no ear pain, nausea, neck pain, plugged ear sensation or vomiting. She has tried decongestant for the symptoms. The treatment provided mild relief.    Problems: There are no problems to display for this patient.   Allergies: No Known Allergies Medications:  Current Outpatient Medications:    benzonatate (TESSALON PERLES) 100 MG capsule, Take 1 capsule (100 mg total) by mouth 3 (three) times daily as needed., Disp: 20 capsule, Rfl: 0   molnupiravir EUA (LAGEVRIO) 200 mg CAPS capsule, Take 4 capsules (800 mg total) by mouth 2 (two) times daily for 5 days., Disp: 40 capsule, Rfl: 0   gabapentin (NEURONTIN) 100 MG capsule, Take 1 capsule (100 mg total) by mouth 2 (two) times daily., Disp: 42 capsule, Rfl: 0  Observations/Objective: Patient is well-developed, well-nourished in no acute distress.  Resting comfortably at home.  Head is normocephalic, atraumatic.  No labored breathing.  Speech is clear and coherent with logical content.  Patient is alert and oriented at baseline.  Nasal congestion   Assessment and Plan: 1. COVID-19 virus detected - molnupiravir EUA (LAGEVRIO) 200 mg CAPS capsule; Take 4 capsules (800 mg total) by mouth 2 (two) times daily for  5 days.  Dispense: 40 capsule; Refill: 0  COVID positive, rest, force fluids, tylenol as needed, Quarantine for at least 5 days and you are fever free, then must wear a mask out in public from day [id number], report any worsening symptoms such as increased shortness of breath, swelling, or continued high fevers. Possible adverse effects discussed with antivirals.    Follow Up Instructions: I discussed the assessment and treatment plan with the patient. The patient was provided an opportunity to ask questions and all were answered. The patient agreed with the plan and demonstrated an understanding of the instructions.  A copy of instructions were sent to the patient via MyChart unless otherwise noted below.     The patient was advised to call back or seek an in-person evaluation if the symptoms worsen or if the condition fails to improve as anticipated.  Time:  I spent 6 minutes with the patient via telehealth technology discussing the above problems/concerns.    Evelina Dun, FNP

## 2023-02-19 IMAGING — MG MM DIGITAL SCREENING BILAT W/ TOMO AND CAD
8 series · 8 of 24 positions shown · non-contrast
Comparison: Previous exam(s).

CLINICAL DATA: Screening.

EXAM:
DIGITAL SCREENING BILATERAL MAMMOGRAM WITH TOMOSYNTHESIS AND CAD
TECHNIQUE: Bilateral screening digital craniocaudal and mediolateral oblique
mammograms were obtained. Bilateral screening digital breast
tomosynthesis was performed. The images were evaluated with
computer-aided detection.

[R MLO synth-2D]
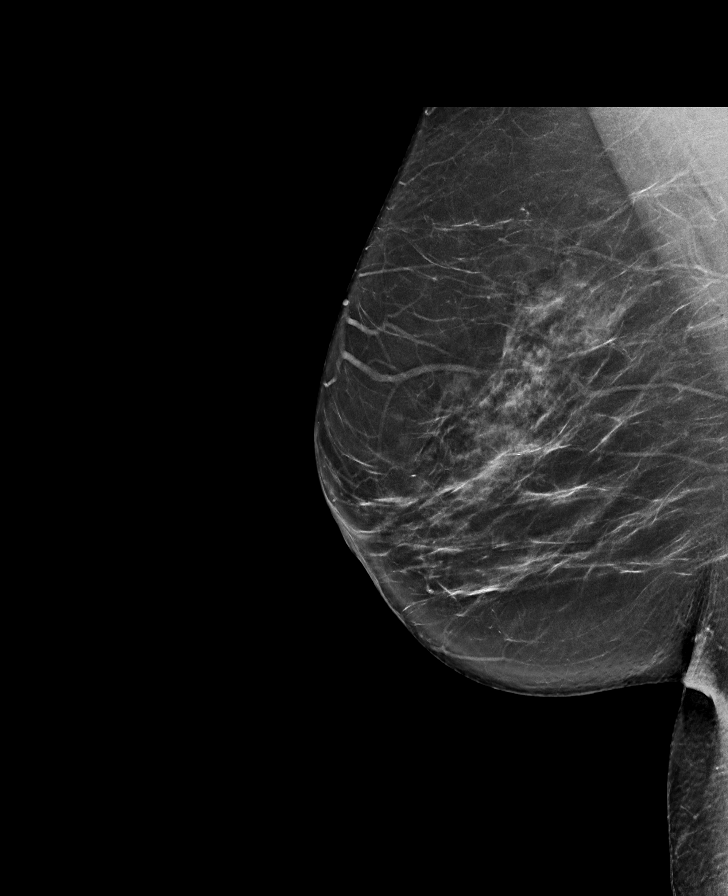

[R CC synth-2D]
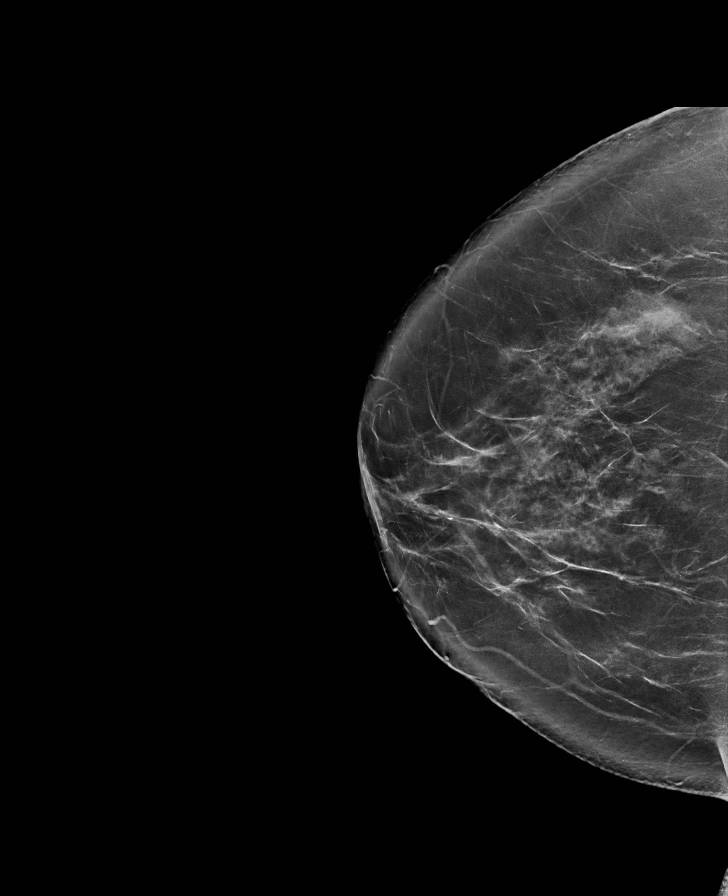

[L MLO synth-2D]
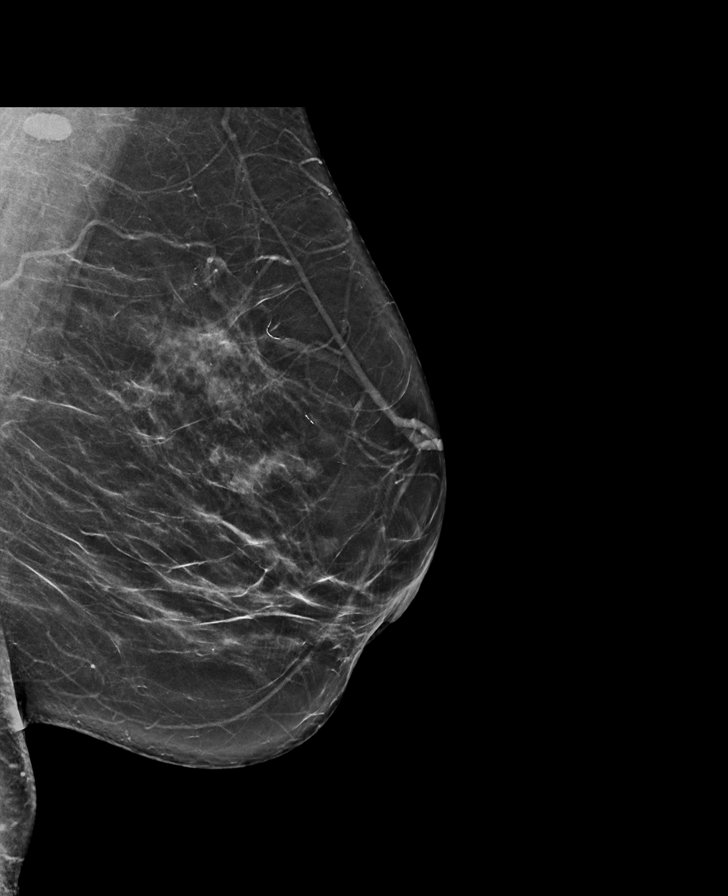

[L CC synth-2D]
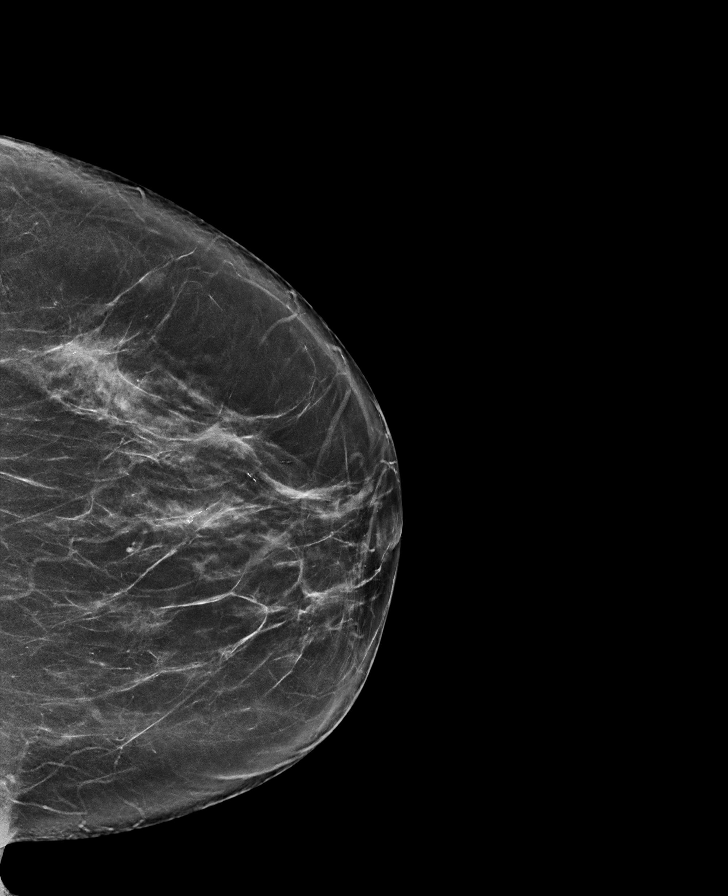

[R MLO tomo · tomo slice 41/81.0]
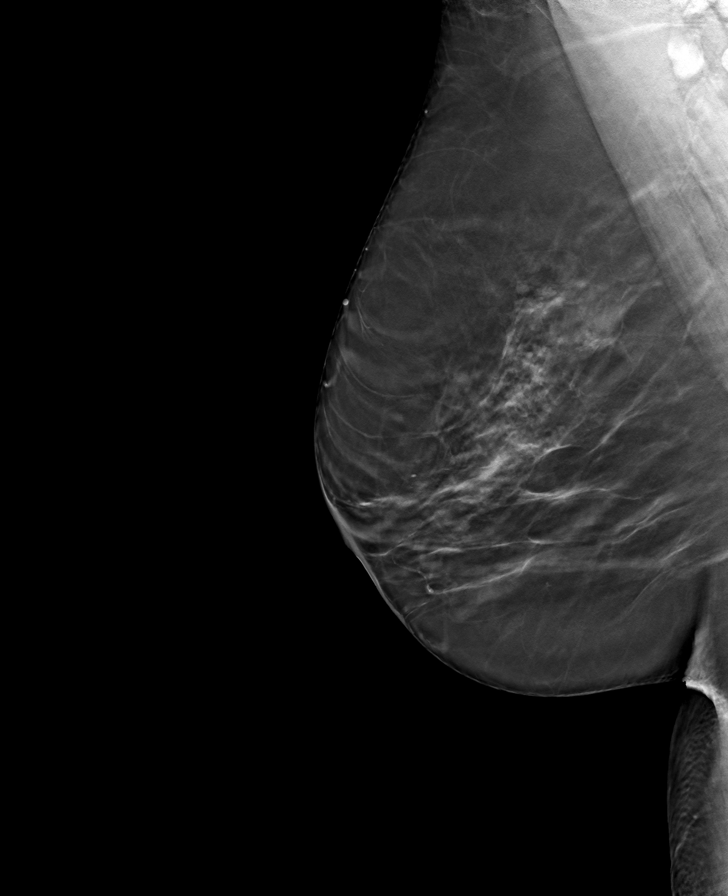

[R CC tomo · tomo slice 45/88.0]
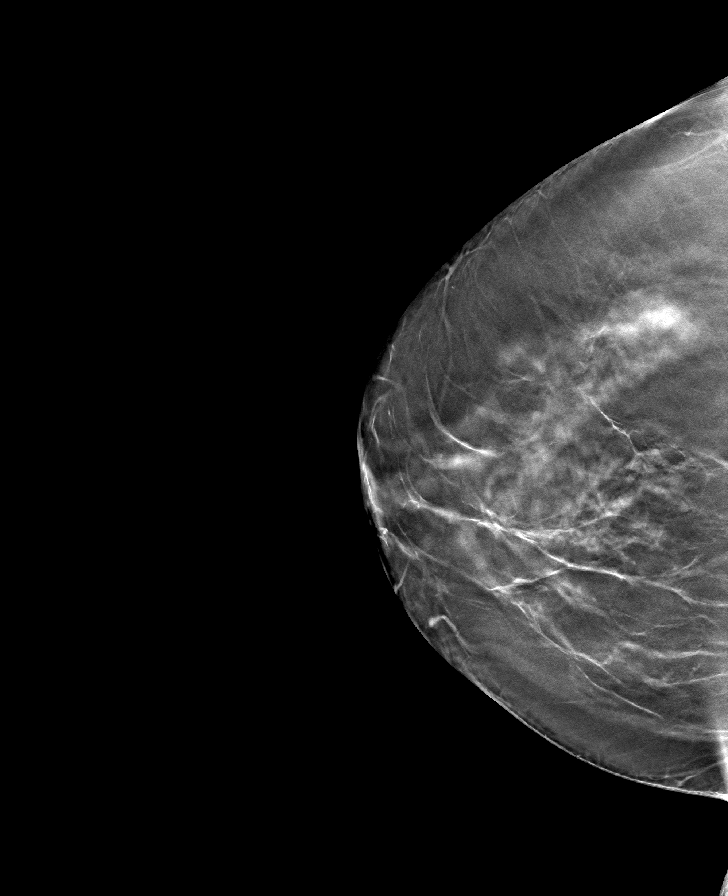

[L CC tomo · tomo slice 37/74.0]
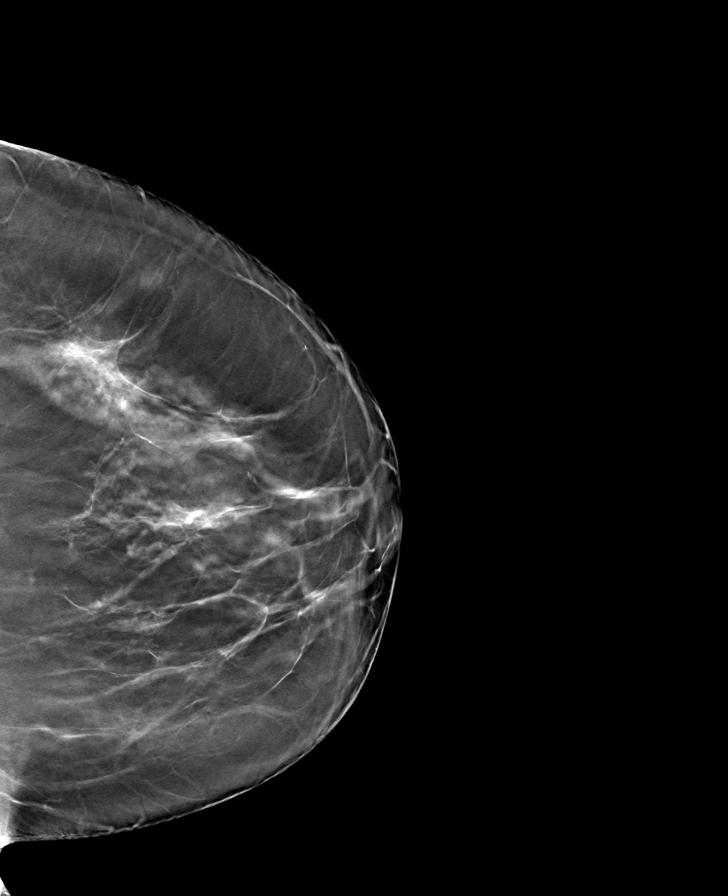

[L MLO tomo · tomo slice 38/75.0]
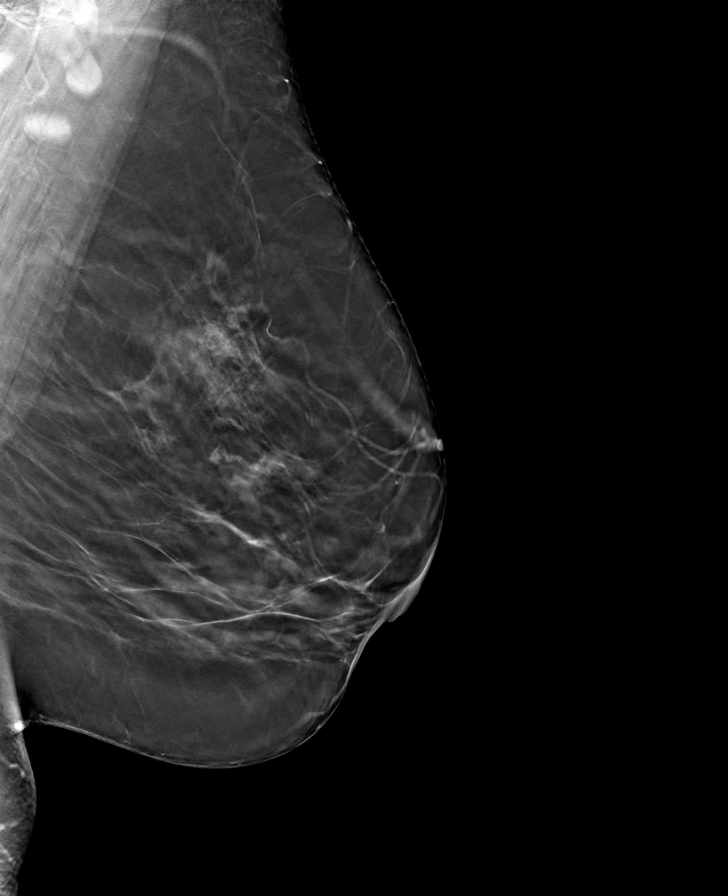

[8 of 24 positions shown; findings below may reference images not displayed]

ACR Breast Density Category b: There are scattered areas of
fibroglandular density.
FINDINGS: There are no findings suspicious for malignancy. The images were
evaluated with computer-aided detection.
IMPRESSION: No mammographic evidence of malignancy. A result letter of this
screening mammogram will be mailed directly to the patient.

RECOMMENDATION:
Screening mammogram in one year. (Code:[id number])

BI-RADS CATEGORY  1: Negative.
